# Patient Record
Sex: Female | Born: 1970 | Race: White | Hispanic: No | Smoking: Never smoker
Health system: Southern US, Community
[De-identification: ages and names within clinical notes are randomized; demographics above are authoritative.]

---

## 2011-02-04 ENCOUNTER — Encounter: Payer: Self-pay | Admitting: Sports Medicine

## 2011-02-04 ENCOUNTER — Ambulatory Visit (INDEPENDENT_AMBULATORY_CARE_PROVIDER_SITE_OTHER): Payer: BC Managed Care – PPO | Admitting: Sports Medicine

## 2011-02-04 VITALS — BP 120/84 | Ht 65.0 in | Wt 126.6 lb

## 2011-02-04 DIAGNOSIS — M25562 Pain in left knee: Secondary | ICD-10-CM | POA: Insufficient documentation

## 2011-02-04 DIAGNOSIS — R269 Unspecified abnormalities of gait and mobility: Secondary | ICD-10-CM | POA: Insufficient documentation

## 2011-02-04 DIAGNOSIS — M25569 Pain in unspecified knee: Secondary | ICD-10-CM

## 2011-02-04 NOTE — Progress Notes (Signed)
  Subjective:    Patient ID: Misty Brock, female    DOB: 06/01/1971, 40 y.o.   MRN: [id number]  HPI [place] in [place] Since then has developed Lt medial knee pain No swelling Sometimes gives a bit with elliptical or even with vacuuming No locking   Review of Systems  no prior history of significant knee injury. She does not have any arthritis or systemic medical issues that affect her joints or muscles.     Objective:   Physical Exam  Hip: Decreased strength on abduction. Full ROM    Knee: No edema. No TTP. Full strength and ROM. Mild crepitus on lateral aspect of left patella on flexion of the knee. Negative McMurrays, lachmanns, drawer signs, posterior sag, varus and valgus tests. Negative patellar apprehesion.  Feet: Moderate pronation, with calcaneal varus.  normal the achilles. Pes planus is moderate  Running gait reveals the patient terminates significantly particularly on the left foot. This gives her some dynamic G. valgus and more stress on the medial knee compartment  MSK ultrasound Complete skin knee reveals some calcification in the quadriceps tendon to the lateral side. There is no suprapatellar pouch swelling. Patellar tendon is normal. Medial and lateral meniscus appears normal. Collateral ligaments are normal. There is no abnormal Doppler activity.    Assessment & Plan:

## 2011-02-04 NOTE — Patient Instructions (Signed)
Quad strength - biking twice per week   In exercise class no deep knee bends or squats, lunges past 45 deg  Knee exercise sheet to do once daily  OK to run - try soft surfaces - start by running 3 to 5 mins and then walk 1 - for total of 20 mins first week Add 5 mins per week Once you are at 35 minutes walk running then  At 4 weeks you are ready to start: 15 mins run / 2 mins walk x 3  For 2 weeks Then try to drop back to 30 minutes continuously and add 5 mins per run per week until training OK  End of runs Ice the knee for 10 mins  Recheck in 6 weeks

## 2011-03-24 ENCOUNTER — Ambulatory Visit (INDEPENDENT_AMBULATORY_CARE_PROVIDER_SITE_OTHER): Payer: BC Managed Care – PPO | Admitting: Sports Medicine

## 2011-03-24 ENCOUNTER — Encounter: Payer: Self-pay | Admitting: Sports Medicine

## 2011-03-24 VITALS — BP 120/86 | HR 85

## 2011-03-24 DIAGNOSIS — M25562 Pain in left knee: Secondary | ICD-10-CM

## 2011-03-24 DIAGNOSIS — M25561 Pain in right knee: Secondary | ICD-10-CM | POA: Insufficient documentation

## 2011-03-24 DIAGNOSIS — M25569 Pain in unspecified knee: Secondary | ICD-10-CM

## 2011-03-24 NOTE — Assessment & Plan Note (Signed)
resolved 

## 2011-03-24 NOTE — Assessment & Plan Note (Addendum)
Pain likely due to overuse with jumping causing quad tendon pain, suprapatellar swelling.  Advised to avoid deep lunging, squatting.  Cross train with cycling to maintain quad strength, continue running once pain and swelling subsides.  Given Helix compression sleeve for knee.  She should modify her activities and I would like to evaluate her in approximately 3 months.

## 2011-03-24 NOTE — Progress Notes (Signed)
  Subjective:    Patient ID: Misty Brock, female    DOB: Sep 13, 1971, 41 y.o.   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   -Was felt to be due to running mechanics.  Was given sport insoles, HEP, and body helix brace - has not done exercises, but feel 95% improvement in pain with running - yesterday started having pain in left knee after running a 5k race 2 days prior, and doing 3 classes at the gym heavy on squatting. Lunging, jumping    Review of Systems see hpi     Objective:   Physical Exam Right Knee: Normal to inspection with no erythema or effusion or obvious bony abnormalities. Palpation normal with no warmth or joint line tenderness or patellar tenderness or condyle tenderness. ROM normal in flexion and extension and lower leg rotation. Ligaments with solid consistent endpoints including ACL, PCL, LCL, MCL. Negative Mcmurray's and provocative meniscal tests. Non painful patellar compression. Patellar and quadriceps tendons unremarkable. Hamstring and quadriceps strength is normal.  Left knee: As above, but notably: - suprapatellar effusion with some tenderness on manipulation of patella    MSK [place]:  Left knee:  Minimal suprapatellar effusion.  Right knee:  Moderate suprapatellar effusion.  No tendon tears.  No evidence of meniscal pathology   Assessment & Plan:

## 2011-08-04 ENCOUNTER — Ambulatory Visit (INDEPENDENT_AMBULATORY_CARE_PROVIDER_SITE_OTHER): Payer: Self-pay | Admitting: Sports Medicine

## 2011-08-04 VITALS — BP 134/78

## 2011-08-04 DIAGNOSIS — M79672 Pain in left foot: Secondary | ICD-10-CM

## 2011-08-04 DIAGNOSIS — M79609 Pain in unspecified limb: Secondary | ICD-10-CM

## 2011-08-04 NOTE — Progress Notes (Signed)
  Subjective:    Patient ID: Misty Brock, female    DOB: 05-Nov-1971, 40 y.o.   MRN: [id number]  HPI Patient had last been seen for knee pain Put in green insoles for pronation Pronated gait improved with insoles Knee improved with those and with body helix  5 days ago 800 m into run had foot pain - had to limp back Pain first over dorsum of left foot Did not run Wore heels at a wedding and had a lot of pain Noted swelling this morning and came into clinic  Did weight lifting last night and squats may have worsened this Lunges definitely hurt  Review of Systems     Objective:   Physical Exam NAD Limps w pain on left Swollen over dorsum of left forefoot over MT 2,3 and 4 Standing calcaneal valgus left Collapse of long arch bilat  Pain on plantar and dorsal MT left None on Rt   MSK ultrasound  there is swelling noted above the distal shaft of the third metatarsal There is a larger pocket of swelling over the distal shaft of the fourth metatarsal and this is exquisitely tender to palpation Transverse view shows the swelling extending into the soft tissue but it does not involve the extensor tendons There is no clear cortical disruption nor is there abnormal Doppler    Assessment & Plan:

## 2011-08-04 NOTE — Patient Instructions (Signed)
Wear post op shoe when you are up on your feet Please start taking 1500 mg of calcium and 800 IU of Vitamin D daily Ice your foot at night Ok to take tylenol or Advil as needed for pain Elevate foot when you can  Follow up in 2-3 weeks for repeat ultrasound

## 2011-08-04 NOTE — Assessment & Plan Note (Signed)
Physical examination and her ultrasound scan both suggest that this is a probable stress fracture of the fourth distal metatarsal and possibly an early stress reaction of the third as well  Plan is to use arch straps, icing and a postop shoe  She was able to walk more comfortably and this after they were placed  We will repeat the scan in 2-3 weeks to see if we can confirm the stress fracture diagnosis  She will need orthotics in the future to protect her from further injury

## 2011-09-10 ENCOUNTER — Ambulatory Visit (INDEPENDENT_AMBULATORY_CARE_PROVIDER_SITE_OTHER): Payer: Self-pay | Admitting: Family Medicine

## 2011-09-10 VITALS — BP 104/74

## 2011-09-10 DIAGNOSIS — M79609 Pain in unspecified limb: Secondary | ICD-10-CM

## 2011-09-10 DIAGNOSIS — M79672 Pain in left foot: Secondary | ICD-10-CM

## 2011-09-10 DIAGNOSIS — M84376A Stress fracture, unspecified foot, initial encounter for fracture: Secondary | ICD-10-CM

## 2011-09-10 NOTE — Patient Instructions (Signed)
Go back to orthotics - wear into them over the next 2 weeks Run in non-minimalist shoes  Can do the elliptical, bike, gym now  Running progression  10) Start with 20 min walk 1 min /run 1 min  11) Advance by 5 mins per week  12. Advance to running 2 min, walk 1 min. Then 3/1, 4/1, 5/1 -- All as tolerated based on pain.  Increase total running volume by no more than 10% per week  12) Please schedule a follow-up appointment in 1 month.

## 2011-09-11 ENCOUNTER — Encounter: Payer: Self-pay | Admitting: Family Medicine

## 2011-09-11 NOTE — Progress Notes (Signed)
  Subjective:    Patient ID: Misty Brock, female    DOB: 1971/01/06, 40 y.o.   MRN: [id number]  HPI  Very pleasant 40 year old female seen by Dr. Darrick Penna one month ago with very painful 4th and 3rd metatarsal shafts with clinical findings and ultrasound findings concerning for metatarsal shaft stress fracture. She was placed in a postoperative shoe and arch binder. Since that time she has done no physical exercise and has had significant diminishing pain. Now she is walking pain-free and her postop shoe.  The PMH, PSH, Social History, Family History, Medications, and allergies have been reviewed in [facility], and have been updated if relevant.   Review of Systems REVIEW OF SYSTEMS  GEN: No fevers, chills. Nontoxic. Primarily MSK c/o today. MSK: Detailed in the HPI GI: tolerating PO intake without difficulty Neuro: No numbness, parasthesias, or tingling associated. Otherwise the pertinent positives of the ROS are noted above.      Objective:   Physical Exam   Physical Exam  Blood pressure 104/74.  GEN: WDWN, NAD, Non-toxic, A & O x 3 HEENT: Atraumatic, Normocephalic. Neck supple. No masses, No LAD. Ears and Nose: No external deformity. EXTR: No c/c/e NEURO Normal gait.  PSYCH: Normally interactive. Conversant. Not depressed or anxious appearing.  Calm demeanor.   LEFT foot: Nontender at the medial and lateral malleoli. Nontender at the base of the 5th metatarsal. Nontender at the navicular, cuboid, and talus bones. Nontender throughout the midfoot. Nontender along all metatarsal shaft including the 4th and 5th metatarsals. Nontender with motion at all phalanges. Significant longitudinal arch collapse as well as dramatic forefoot breakdown with bunionette and bunion formation bilaterally and hammertoe formation.  Diagnostic Ultrasound Evaluation [employer] E, MSK ultrasound, MSK probe Anatomy scanned: left foot Indication: Pain Findings: prior ultrasound reviewed. On the  dorsum of the 4th metatarsal on longitudinal and transverse view there is hyperechoic  Findings above shaft in the region where hypoechoic fluid appreciated on last exam. Consistent with soft versus bony callus. There is also some hyperechoic changes adjacent to the 3rd metatarsal similarly, but to a lesser degree compared to the 4th. The patient has no pain on palpation in this region.     Assessment & Plan:   1. Stress fracture of metatarsal bone   2. Left foot pain     Ultrasound findings consistent with healing stress fracture of the 4th and probable 3rd metatarsal shafts.  Clinically, the patient is improved and having no pain with walking. No pain on palpation and no pain with jumping up and down. We discussed return to exercise, and she is due to begin with bicycling and doing an elliptical machine over the next few days, and over the weekend try some walk runs  Refer to the patient instructions sections for details of plan shared with patient.

## 2016-08-11 ENCOUNTER — Ambulatory Visit: Payer: Self-pay | Admitting: Sports Medicine

## 2019-11-30 ENCOUNTER — Encounter: Payer: Self-pay | Admitting: Family Medicine

## 2019-11-30 ENCOUNTER — Ambulatory Visit (INDEPENDENT_AMBULATORY_CARE_PROVIDER_SITE_OTHER): Payer: Commercial Managed Care - PPO | Admitting: Family Medicine

## 2019-11-30 ENCOUNTER — Ambulatory Visit (INDEPENDENT_AMBULATORY_CARE_PROVIDER_SITE_OTHER): Payer: Commercial Managed Care - PPO

## 2019-11-30 ENCOUNTER — Other Ambulatory Visit: Payer: Self-pay

## 2019-11-30 ENCOUNTER — Other Ambulatory Visit: Payer: Self-pay | Admitting: Family Medicine

## 2019-11-30 VITALS — BP 110/82 | HR 65 | Ht 65.0 in | Wt 126.0 lb

## 2019-11-30 DIAGNOSIS — M1712 Unilateral primary osteoarthritis, left knee: Secondary | ICD-10-CM

## 2019-11-30 DIAGNOSIS — M25562 Pain in left knee: Secondary | ICD-10-CM

## 2019-11-30 DIAGNOSIS — G8929 Other chronic pain: Secondary | ICD-10-CM

## 2019-11-30 MED ORDER — VITAMIN D (ERGOCALCIFEROL) 1.25 MG (50000 UNIT) PO CAPS: 50000.0000 [IU] | ORAL_CAPSULE | ORAL | 0 refills | Status: DC

## 2019-11-30 MED ORDER — MELOXICAM 15 MG PO TABS: 15.0000 mg | ORAL_TABLET | Freq: Every day | ORAL | 0 refills | Status: DC

## 2019-11-30 NOTE — Assessment & Plan Note (Signed)
Patient does have more of a patellofemoral arthritis.  We discussed meloxicam, vitamin D supplementation, bracing with a Tru pull lite secondary to the lateral translation of the kneecap noted.  Patient will try conservative approach first and if fails consider formal physical therapy and injections.

## 2019-11-30 NOTE — Progress Notes (Signed)
[facility] [place] Phone: [phone] Subjective:   [name] No, am serving as a scribe for Dr. Hulan Saas. This visit occurred during the SARS-CoV-2 public health emergency.  Safety protocols were in place, including screening questions prior to the visit, additional usage of staff PPE, and extensive cleaning of exam room while observing appropriate contact time as indicated for disinfecting solutions.   I'm seeing this patient by the request  of:  No primary care provider on file.  CC: Knee pain left-sided  [id number]  Misty Brock is a 49 y.o. female coming in with complaint of left knee pain. 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 Rested for 6 weeks near Thanksgiving and tried running again but her pain came back. Pain with flexion. Pain moves from anterior to posterior aspect. Initial pain was over quad tendon but then moved to medial aspect. Last time she ran was end of December. Has only been walking and sometimes feels pain with walking. Does kettlebell work and this has not been bothering her.       History reviewed. No pertinent past medical history. History reviewed. No pertinent surgical history. Social History   Socioeconomic History  . Marital status: Divorced    Spouse name: Not on file  . Number of children: Not on file  . Years of education: Not on file  . Highest education level: Not on file  Occupational History  . Not on file  Tobacco Use  . Smoking status: Never Smoker  . Smokeless tobacco: Never Used  Substance and Sexual Activity  . Alcohol use: Not on file  . Drug use: Not on file  . Sexual activity: Not on file  Other Topics Concern  . Not on file  Social History Narrative  . Not on file   Social Determinants of Health   Financial Resource Strain:   . Difficulty of Paying Living Expenses: Not on file  Food Insecurity:   . Worried About  Charity fundraiser in the Last Year: Not on file  . Ran Out of Food in the Last Year: Not on file  Transportation Needs:   . Lack of Transportation (Medical): Not on file  . Lack of Transportation (Non-Medical): Not on file  Physical Activity:   . Days of Exercise per Week: Not on file  . Minutes of Exercise per Session: Not on file  Stress:   . Feeling of Stress : Not on file  Social Connections:   . Frequency of Communication with Friends and Family: Not on file  . Frequency of Social Gatherings with Friends and Family: Not on file  . Attends Religious Services: Not on file  . Active Member of Clubs or Organizations: Not on file  . Attends Archivist Meetings: Not on file  . Marital Status: Not on file   No Known Allergies History reviewed. No pertinent family history.     Current Outpatient Medications (Analgesics):  .  meloxicam (MOBIC) 15 MG tablet, Take 1 tablet (15 mg total) by mouth daily.   Current Outpatient Medications (Other):  .  diphenhydramine-acetaminophen (TYLENOL PM) 25-500 MG TABS tablet, Take 1 tablet by mouth at bedtime as needed. Marland Kitchen  escitalopram (LEXAPRO) 10 MG tablet, Take 10 mg by mouth daily. .  Vitamin D, Ergocalciferol, (DRISDOL) 1.25 MG (50000 UNIT) CAPS capsule, Take 1 capsule (50,000 Units total) by mouth every 7 (seven) days.  Past medical history, social, surgical and family history all reviewed in electronic medical record.  No pertanent information unless stated regarding to the chief complaint.   Review of Systems:  No headache, visual changes, nausea, vomiting, diarrhea, constipation, dizziness, abdominal pain, skin rash, fevers, chills, night sweats, weight loss, swollen lymph nodes, body aches, joint swelling, chest pain, shortness of breath, mood changes. POSITIVE muscle aches  Objective  Blood pressure 110/82, pulse 65, height 5\' 5"  (1.651 m), weight 126 lb (57.2 kg), SpO2 99 %.   General: No apparent distress alert and  oriented x3 mood and affect normal, dressed appropriately.  HEENT: Pupils equal, extraocular movements intact  Respiratory: Patient's speak in full sentences and does not appear short of breath  Cardiovascular: No lower extremity edema, non tender, no erythema  Skin: Warm dry intact with no signs of infection or rash on extremities or on axial skeleton.  Abdomen: Soft nontender  Neuro: Cranial nerves II through XII are intact, neurovascularly intact in all extremities with 2+ DTRs and 2+ pulses.  Lymph: No lymphadenopathy of posterior or anterior cervical chain or axillae bilaterally.  Gait normal with good balance and coordination.  MSK:  Non tender with full range of motion and good stability and symmetric strength and tone of shoulders, elbows, wrist, hip and ankles bilaterally.  Left knee exam shows the patient does have mild lateral translation noted.  Mild effusion noted.  Mild instability of the LCL but does have an endpoint.  Does seem to have laxity compared to contralateral side.  Lacks the last 2 degrees of extension and flexion.  Limited musculoskeletal ultrasound was performed and interpreted by  Limited ultrasound of patient's patellofemoral joint shows that effusion is noted.  Patient does have a reactive synovitis it appears.  Patient also has significant narrowing of the joint space of the patellofemoral laterally.  Meniscus appear to be intact.  Patient's LCL does have some chronic changes but appears to be intact with dynamic testing Impression: Patellofemoral arthritis   Impression and Recommendations:     This case required medical decision making of moderate complexity. The above documentation has been reviewed and is accurate and complete Judi Saa, DO       Note: This dictation was prepared with Dragon dictation along with smaller phrase technology. Any transcriptional errors that result from this process are unintentional.

## 2019-11-30 NOTE — Patient Instructions (Addendum)
Good to see you.  Ice 20 minutes 2 times daily. Usually after activity and before bed. Exercises 3 times a week.  Once weekly vitamin D for 12 weeks.  Meloxicam  Knee brace Xray today See me again in 4-6 weeks

## 2019-12-25 ENCOUNTER — Other Ambulatory Visit: Payer: Self-pay | Admitting: Family Medicine

## 2020-01-11 ENCOUNTER — Encounter: Payer: Self-pay | Admitting: Family Medicine

## 2020-01-11 ENCOUNTER — Other Ambulatory Visit: Payer: Self-pay

## 2020-01-11 ENCOUNTER — Ambulatory Visit (INDEPENDENT_AMBULATORY_CARE_PROVIDER_SITE_OTHER): Payer: Commercial Managed Care - PPO | Admitting: Family Medicine

## 2020-01-11 DIAGNOSIS — M1712 Unilateral primary osteoarthritis, left knee: Secondary | ICD-10-CM | POA: Diagnosis not present

## 2020-01-11 NOTE — Progress Notes (Signed)
[facility] [address] [place] [address] Phone: [phone] Subjective:   I Misty Brock am serving as a Neurosurgeon for Dr. Antoine Primas.  This visit occurred during the SARS-CoV-2 public health emergency.  Safety protocols were in place, including screening questions prior to the visit, additional usage of staff PPE, and extensive cleaning of exam room while observing appropriate contact time as indicated for disinfecting solutions.   I'm seeing this patient by the request  of:  Patient, No Pcp Per  CC: Left knee pain follow-up  [id number]   11/30/2019 Patient does have more of a patellofemoral arthritis.  We discussed meloxicam, vitamin D supplementation, bracing with a Tru pull lite secondary to the lateral translation of the kneecap noted.  Patient will try conservative approach first and if fails consider formal physical therapy and injections.  Update 01/11/2020 Misty Brock is a 49 y.o. female coming in with complaint of left knee pain. Patient states shes doing a lot better. Hasn't been running on the knee.  Found to have patellofemoral arthritis.  Doing much better though.  States 80% better but has not been running.  Would like to get back to running.  Denies any instability with daily activities and nothing that is keeping her up at night.      No past medical history on file. No past surgical history on file. Social History   Socioeconomic History  . Marital status: Divorced    Spouse name: Not on file  . Number of children: Not on file  . Years of education: Not on file  . Highest education level: Not on file  Occupational History  . Not on file  Tobacco Use  . Smoking status: Never Smoker  . Smokeless tobacco: Never Used  Substance and Sexual Activity  . Alcohol use: Not on file  . Drug use: Not on file  . Sexual activity: Not on file  Other Topics Concern  . Not on file  Social History Narrative  . Not on file    Social Determinants of Health   Financial Resource Strain:   . Difficulty of Paying Living Expenses: Not on file  Food Insecurity:   . Worried About Programme researcher, broadcasting/film/video in the Last Year: Not on file  . Ran Out of Food in the Last Year: Not on file  Transportation Needs:   . Lack of Transportation (Medical): Not on file  . Lack of Transportation (Non-Medical): Not on file  Physical Activity:   . Days of Exercise per Week: Not on file  . Minutes of Exercise per Session: Not on file  Stress:   . Feeling of Stress : Not on file  Social Connections:   . Frequency of Communication with Friends and Family: Not on file  . Frequency of Social Gatherings with Friends and Family: Not on file  . Attends Religious Services: Not on file  . Active Member of Clubs or Organizations: Not on file  . Attends Banker Meetings: Not on file  . Marital Status: Not on file   No Known Allergies no known drug allergies No family history on file.  No family history of autoimmune     Current Outpatient Medications (Analgesics):  .  meloxicam (MOBIC) 15 MG tablet, TAKE 1 TABLET(15 MG) BY MOUTH DAILY   Current Outpatient Medications (Other):  .  diphenhydramine-acetaminophen (TYLENOL PM) 25-500 MG TABS tablet, Take 1 tablet by mouth at bedtime as needed. Marland Kitchen  escitalopram (LEXAPRO) 10  MG tablet, Take 10 mg by mouth daily. .  Vitamin D, Ergocalciferol, (DRISDOL) 1.25 MG (50000 UNIT) CAPS capsule, Take 1 capsule (50,000 Units total) by mouth every 7 (seven) days.   Reviewed prior external information including notes and imaging from  primary care provider As well as notes that were available from care everywhere and other healthcare systems.  Past medical history, social, surgical and family history all reviewed in electronic medical record.  No pertanent information unless stated regarding to the chief complaint.   Review of Systems:  No headache, visual changes, nausea, vomiting,  diarrhea, constipation, dizziness, abdominal pain, skin rash, fevers, chills, night sweats, weight loss, swollen lymph nodes, body aches, joint swelling, chest pain, shortness of breath, mood changes. POSITIVE muscle aches  Objective  Blood pressure 120/90, pulse 73, height 5\' 5"  (1.651 m), weight 135 lb (61.2 kg), SpO2 98 %.   General: No apparent distress alert and oriented x3 mood and affect normal, dressed appropriately.  HEENT: Pupils equal, extraocular movements intact  Respiratory: Patient's speak in full sentences and does not appear short of breath  Cardiovascular: No lower extremity edema, non tender, no erythema  Skin: Warm dry intact with no signs of infection or rash on extremities or on axial skeleton.  Abdomen: Soft nontender  Neuro: Cranial nerves II through XII are intact, neurovascularly intact in all extremities with 2+ DTRs and 2+ pulses.  Lymph: No lymphadenopathy of posterior or anterior cervical chain or axillae bilaterally.  Gait normal with good balance and coordination.  MSK:  Non tender with full range of motion and good stability and symmetric strength and tone of shoulders, elbows, wrist, hip, ankles bilaterally.  Left knee still shows some mild lateral tracking of the patella noted.  Patient does have a mild positive patellar grind.  No structural instability though noted of the   Impression and Recommendations:      The above documentation has been reviewed and is accurate and complete Lyndal Pulley, DO       Note: This dictation was prepared with Dragon dictation along with smaller phrase technology. Any transcriptional errors that result from this process are unintentional.

## 2020-01-11 NOTE — Patient Instructions (Addendum)
Good to see you Can jog total 30 mins 3 times a week Week 1 1 min jog 1 min walk Week 2 2 min jog 1 min walk Week 3 3 min jog walk Week 6 full run Continue to exercise and wear brace  See me again in 6 weeks

## 2020-01-11 NOTE — Assessment & Plan Note (Signed)
Patient has made some improvement.  We discussed using a brace with running.  Running progression given, discussed icing regimen.  Vitamin D, oral anti-inflammatories as needed.  Worsening symptoms consider injection.  Follow-up with me again in 6 to 8 weeks

## 2020-01-21 ENCOUNTER — Other Ambulatory Visit: Payer: Self-pay | Admitting: Family Medicine

## 2020-01-24 ENCOUNTER — Encounter: Payer: Self-pay | Admitting: Family Medicine

## 2020-01-25 ENCOUNTER — Other Ambulatory Visit: Payer: Self-pay

## 2020-01-25 MED ORDER — MELOXICAM 15 MG PO TABS: ORAL_TABLET | ORAL | 0 refills | Status: AC

## 2020-01-25 MED ORDER — VITAMIN D (ERGOCALCIFEROL) 1.25 MG (50000 UNIT) PO CAPS: 50000.0000 [IU] | ORAL_CAPSULE | ORAL | 0 refills | Status: AC

## 2020-02-22 ENCOUNTER — Other Ambulatory Visit: Payer: Self-pay

## 2020-02-22 ENCOUNTER — Ambulatory Visit (INDEPENDENT_AMBULATORY_CARE_PROVIDER_SITE_OTHER): Payer: Commercial Managed Care - PPO | Admitting: Family Medicine

## 2020-02-22 ENCOUNTER — Encounter: Payer: Self-pay | Admitting: Family Medicine

## 2020-02-22 DIAGNOSIS — M1712 Unilateral primary osteoarthritis, left knee: Secondary | ICD-10-CM

## 2020-02-22 NOTE — Assessment & Plan Note (Signed)
Patient is conservative therapy at this time.  Does have meloxicam for breakthrough.  I believe patient will do very well overall.  Discussed icing regimen and home exercises.  Follow-up again in 4 to 6 weeks.

## 2020-02-22 NOTE — Progress Notes (Signed)
[facility] [place] Phone: [phone] Subjective:   [name] No, am serving as a scribe for Dr. Hulan Saas. This visit occurred during the SARS-CoV-2 public health emergency.  Safety protocols were in place, including screening questions prior to the visit, additional usage of staff PPE, and extensive cleaning of exam room while observing appropriate contact time as indicated for disinfecting solutions.   I'm seeing this patient by the request  of:  Patient, No Pcp Per  CC: Knee pain follow-up  IRC:[id number]   01/11/2020 Patient has made some improvement.  We discussed using a brace with running.  Running progression given, discussed icing regimen.  Vitamin D, oral anti-inflammatories as needed.  Worsening symptoms consider injection.  Follow-up with me again in 6 to 8 weeks  Update 02/22/2020 Misty Brock is a 49 y.o. female coming in with complaint of left knee pain. Wears brace with activity. No problems with running but notices a buckling with walking intermittently. Patient has been running 6 minute bursts.  Patient was states that she is feeling 90 to 95% better.      No past medical history on file. No past surgical history on file. Social History   Socioeconomic History  . Marital status: Divorced    Spouse name: Not on file  . Number of children: Not on file  . Years of education: Not on file  . Highest education level: Not on file  Occupational History  . Not on file  Tobacco Use  . Smoking status: Never Smoker  . Smokeless tobacco: Never Used  Substance and Sexual Activity  . Alcohol use: Not on file  . Drug use: Not on file  . Sexual activity: Not on file  Other Topics Concern  . Not on file  Social History Narrative  . Not on file   Social Determinants of Health   Financial Resource Strain:   . Difficulty of Paying Living Expenses:   Food Insecurity:   . Worried About Sales executive in the Last Year:   . Arboriculturist in the Last Year:   Transportation Needs:   . Film/video editor (Medical):   Marland Kitchen Lack of Transportation (Non-Medical):   Physical Activity:   . Days of Exercise per Week:   . Minutes of Exercise per Session:   Stress:   . Feeling of Stress :   Social Connections:   . Frequency of Communication with Friends and Family:   . Frequency of Social Gatherings with Friends and Family:   . Attends Religious Services:   . Active Member of Clubs or Organizations:   . Attends Archivist Meetings:   Marland Kitchen Marital Status:    No Known Allergies No family history on file.     Current Outpatient Medications (Analgesics):  .  meloxicam (MOBIC) 15 MG tablet, TAKE 1 TABLET(15 MG) BY MOUTH DAILY   Current Outpatient Medications (Other):  .  diphenhydramine-acetaminophen (TYLENOL PM) 25-500 MG TABS tablet, Take 1 tablet by mouth at bedtime as needed. Marland Kitchen  escitalopram (LEXAPRO) 10 MG tablet, Take 10 mg by mouth daily. .  Vitamin D, Ergocalciferol, (DRISDOL) 1.25 MG (50000 UNIT) CAPS capsule, Take 1 capsule (50,000 Units total) by mouth every 7 (seven) days.   Reviewed prior external information including notes and imaging from  primary care provider As well as notes that were available from care everywhere and other healthcare systems.  Past medical history, social, surgical  and family history all reviewed in electronic medical record.  No pertanent information unless stated regarding to the chief complaint.   Review of Systems:  No headache, visual changes, nausea, vomiting, diarrhea, constipation, dizziness, abdominal pain, skin rash, fevers, chills, night sweats, weight loss, swollen lymph nodes, body aches, joint swelling, chest pain, shortness of breath, mood changes. POSITIVE muscle aches  Objective  Blood pressure 106/82, pulse 63, height 5\' 5"  (1.651 m), weight 145 lb (65.8 kg), SpO2 99 %.   General: No apparent distress alert and  oriented x3 mood and affect normal, dressed appropriately.  HEENT: Pupils equal, extraocular movements intact  Respiratory: Patient's speak in full sentences and does not appear short of breath  Cardiovascular: No lower extremity edema, non tender, no erythema  Neuro: Cranial nerves II through XII are intact, neurovascularly intact in all extremities with 2+ DTRs and 2+ pulses.  Gait normal with good balance and coordination.  MSK: Left knee exam shows the patient does have very mild lateral tracking of the patella noted with a mild positive patellar grind still noted.    Impression and Recommendations:     This case required medical decision making of moderate complexity. The above documentation has been reviewed and is accurate and complete , DO       Note: This dictation was prepared with Dragon dictation along with smaller phrase technology. Any transcriptional errors that result from this process are unintentional.

## 2020-02-22 NOTE — Patient Instructions (Signed)
8 min jog one minute off If 3 rounds of that ok good to run non-stop See me in 8 weeks if not perfect Continue brace for at least this month

## 2020-04-18 ENCOUNTER — Ambulatory Visit: Payer: Commercial Managed Care - PPO | Admitting: Family Medicine

## 2021-06-27 IMAGING — DX DG KNEE COMPLETE 4+V*L*
4 series · 4 of 4 positions shown · non-contrast
Comparison: None.

CLINICAL DATA: Chronic left knee pain for several months, no known
injury, initial encounter

EXAM:
LEFT KNEE - COMPLETE 4+ VIEW

[knee ap]
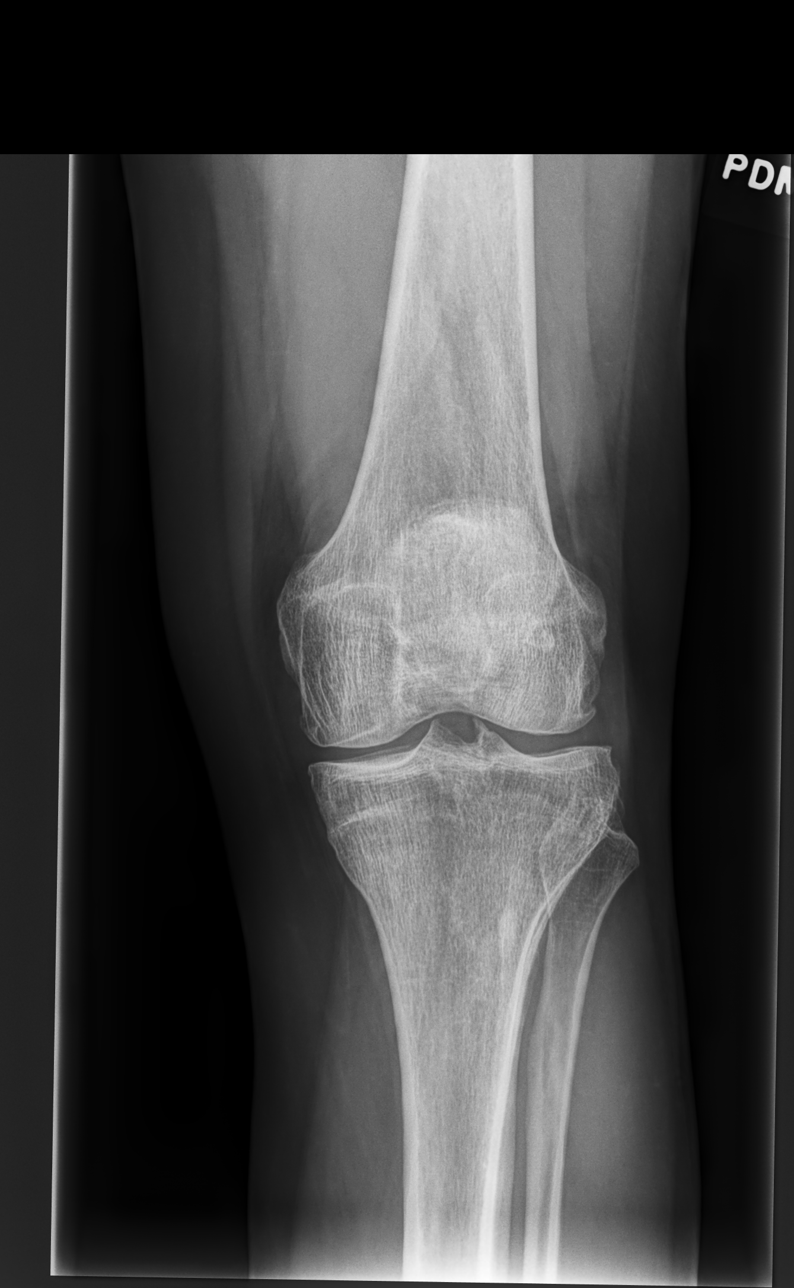

[knee lat]
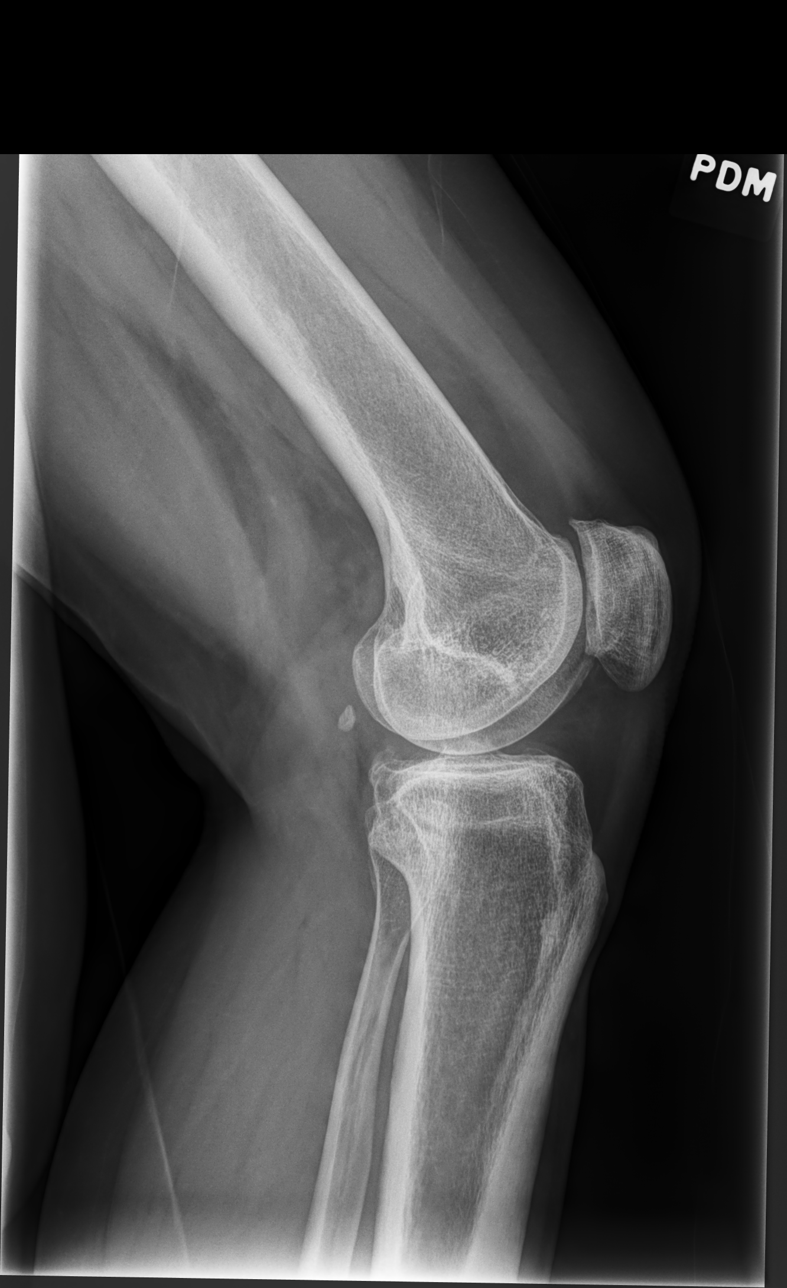

[knee (tunnel view) pa]
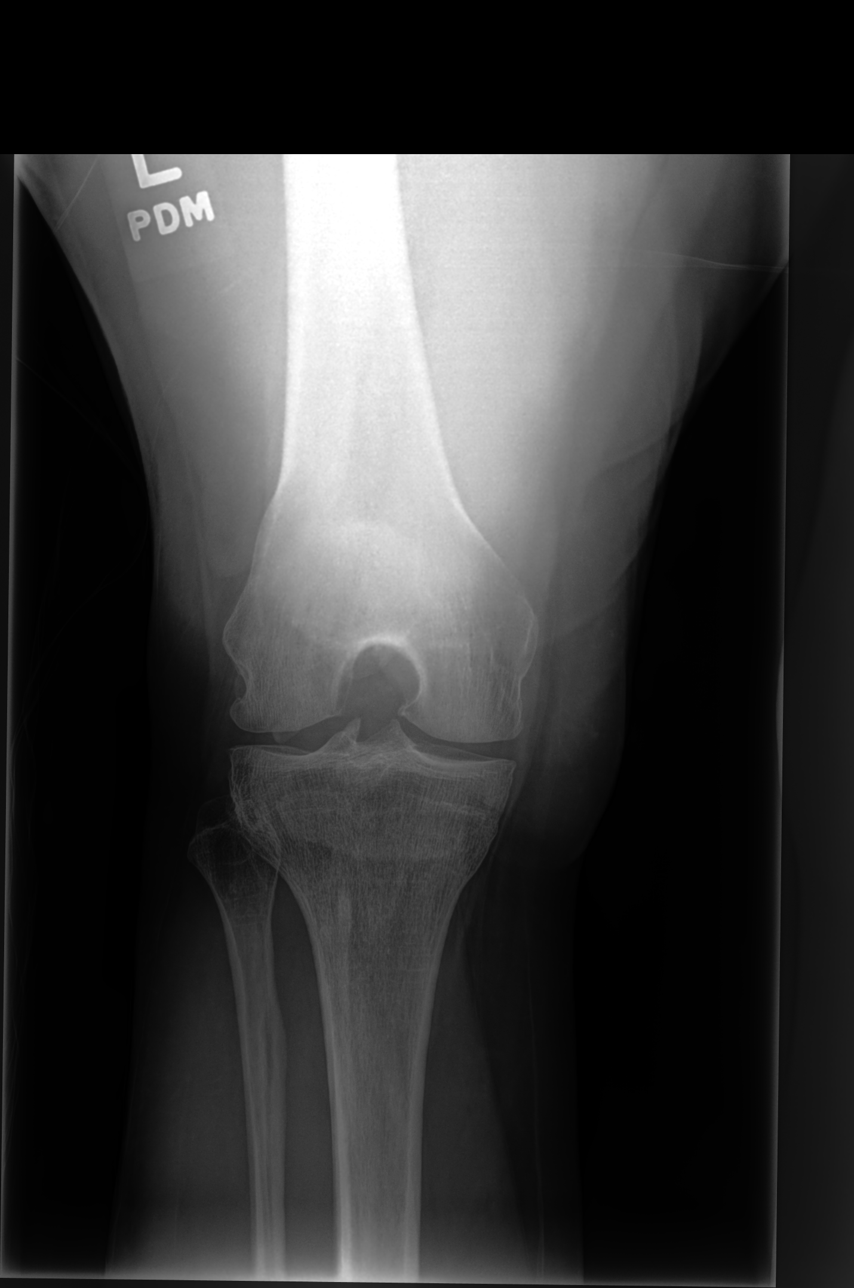

[patella (sunrise) tan]
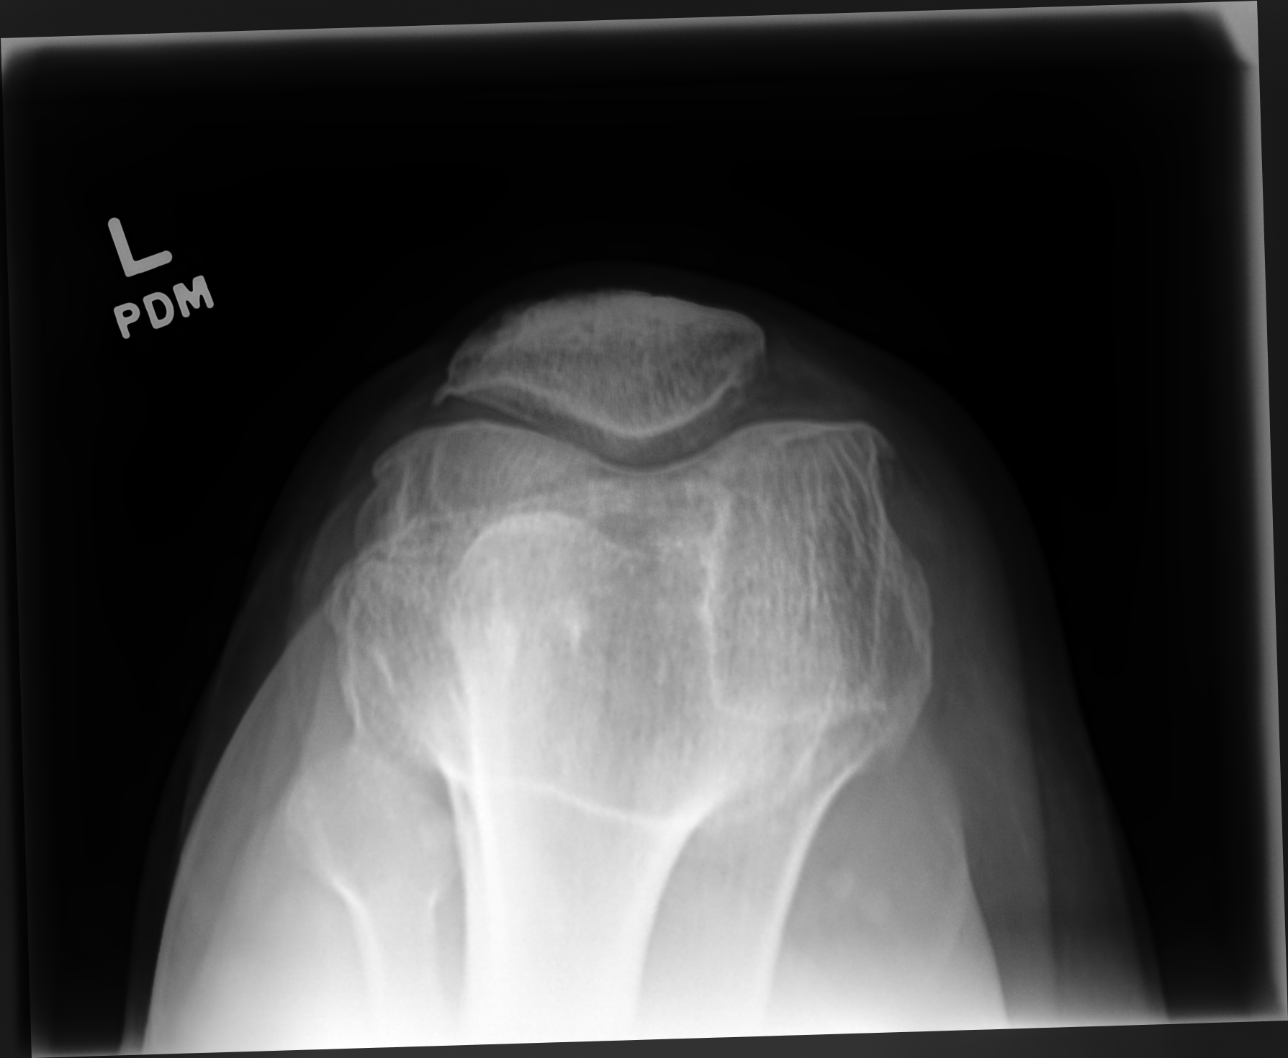

[4 of 4 positions shown; findings below may reference images not displayed]

FINDINGS: Mild degenerative changes are noted in all 3 joint compartments. No
joint effusion is seen. No acute fracture or dislocation is noted.
IMPRESSION: Degenerative change without acute abnormality.
